# Patient Record
Sex: Female | Born: 1995 | Race: White | Hispanic: No | Marital: Single | Smoking: Never smoker
Health system: Southern US, Community
[De-identification: ages and names within clinical notes are randomized; demographics above are authoritative.]

## PROBLEM LIST (undated history)

## (undated) DIAGNOSIS — F32A Depression, unspecified: Secondary | ICD-10-CM

## (undated) DIAGNOSIS — F319 Bipolar disorder, unspecified: Secondary | ICD-10-CM

## (undated) DIAGNOSIS — F329 Major depressive disorder, single episode, unspecified: Secondary | ICD-10-CM

## (undated) HISTORY — PX: WISDOM TOOTH EXTRACTION: SHX21

## (undated) HISTORY — PX: TONSILLECTOMY: SUR1361

---
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
## 2013-01-31 ENCOUNTER — Other Ambulatory Visit: Payer: Self-pay | Admitting: Internal Medicine

## 2013-12-20 ENCOUNTER — Encounter (HOSPITAL_COMMUNITY): Payer: Self-pay | Admitting: Emergency Medicine

## 2013-12-20 ENCOUNTER — Emergency Department (HOSPITAL_COMMUNITY)
Admission: EM | Admit: 2013-12-20 | Discharge: 2013-12-21 | Disposition: A | Discharge status: Home / Self Care | Payer: BC Managed Care – PPO | Attending: Emergency Medicine | Admitting: Emergency Medicine

## 2013-12-20 DIAGNOSIS — J45901 Unspecified asthma with (acute) exacerbation: Secondary | ICD-10-CM | POA: Insufficient documentation

## 2013-12-20 DIAGNOSIS — R0602 Shortness of breath: Secondary | ICD-10-CM

## 2013-12-20 DIAGNOSIS — F3289 Other specified depressive episodes: Secondary | ICD-10-CM | POA: Insufficient documentation

## 2013-12-20 DIAGNOSIS — F329 Major depressive disorder, single episode, unspecified: Secondary | ICD-10-CM | POA: Insufficient documentation

## 2013-12-20 DIAGNOSIS — R404 Transient alteration of awareness: Secondary | ICD-10-CM | POA: Insufficient documentation

## 2013-12-20 DIAGNOSIS — F319 Bipolar disorder, unspecified: Secondary | ICD-10-CM | POA: Insufficient documentation

## 2013-12-20 DIAGNOSIS — J029 Acute pharyngitis, unspecified: Secondary | ICD-10-CM | POA: Insufficient documentation

## 2013-12-20 HISTORY — DX: Depression, unspecified: F32.A

## 2013-12-20 HISTORY — DX: Bipolar disorder, unspecified: F31.9

## 2013-12-20 HISTORY — DX: Major depressive disorder, single episode, unspecified: F32.9

## 2013-12-20 NOTE — ED Notes (Signed)
Pt ambulated to restroom with no assistance. ?

## 2013-12-20 NOTE — ED Notes (Addendum)
C/o multiple sx over the last week. Lists sob, unable to get a good deep sufficient breath, also difficulty swallowing, "doesn't come naturally, have to tell myself to swallow", also bilateral jaw tightness, changing comfort and ability to sleep or eat. Upon arrival tonight my legs felt numb". Also nasal congestion. (denies: other pains, fever, nvd), here with mother. Has tried dayquil and ibuprofen, and neb tx.  And inhaler PTA.  Describes pain as generalized"body, 10/10", just feel light headed & dizzy like I am going to pass out all the time, worse with inspirations. Alert, NAD, calm, interactive, resps e/u, no dyspnea noted, LS CTA.

## 2013-12-21 ENCOUNTER — Emergency Department (HOSPITAL_COMMUNITY): Payer: BC Managed Care – PPO

## 2013-12-21 LAB — I-STAT CHEM 8, ED
BUN: 12 mg/dL (ref 6–23)
CALCIUM ION: 1.18 mmol/L (ref 1.12–1.23)
CHLORIDE: 105 meq/L (ref 96–112)
Creatinine, Ser: 0.7 mg/dL (ref 0.50–1.10)
Glucose, Bld: 89 mg/dL (ref 70–99)
HEMATOCRIT: 42 % (ref 36.0–46.0)
HEMOGLOBIN: 14.3 g/dL (ref 12.0–15.0)
Potassium: 4 mEq/L (ref 3.7–5.3)
Sodium: 142 mEq/L (ref 137–147)
TCO2: 24 mmol/L (ref 0–100)

## 2013-12-21 MED ORDER — LORAZEPAM 1 MG PO TABS: 0.5000 mg | ORAL_TABLET | Freq: Three times a day (TID) | ORAL | Status: AC | PRN

## 2013-12-21 NOTE — ED Provider Notes (Signed)
CSN: [id number]     Arrival date & time 12/20/13  2126 History   First MD Initiated Contact with Patient 12/21/13 0005     Chief Complaint  Patient presents with  . Shortness of Breath  . Altered Mental Status  . Sore Throat  . Anxiety     (Consider location/radiation/quality/duration/timing/severity/associated sxs/prior Treatment) HPI Comments: 18 year old female, history of asthma as well as bipolar disorder and recently been told that she had panic attacks and anxiety who presents with a complaint of shortness of breath which has been present for over one week. This initially was mild, the episodes last 30 minutes and occur several times a day. They are spontaneous in onset, they do not improve with albuterol treatments either by metered-dose inhaler or nebulizer, she has not been taking her anti-nausea medication which she has been prescribed, - hydroxyzine. She denies coughing fevers chills nausea vomiting swelling back pain chest pain abdominal pain dysuria or diarrhea. She states that her menstrual cycles have been coming longer and have her but has never been diagnosed with anemia. She does have periods where she wakes up in the nighttime with shortness of breath which again is short-lived and resolve spontaneously. During these episodes she feels like she is "knot in my body" feeling like she is lightheaded or disoriented. Despite the feeling she is able to function in her roll at work while this happens, she works as a Production assistant, radio at [employer].  Patient is a 18 y.o. female presenting with shortness of breath, altered mental status, pharyngitis, and anxiety. The history is provided by the patient and a parent.  Shortness of Breath Altered Mental Status Sore Throat Associated symptoms include shortness of breath.  Anxiety Associated symptoms include shortness of breath.    Past Medical History  Diagnosis Date  . Asthma   . Bipolar disorder   . Depression    Past Surgical History   Procedure Laterality Date  . Tonsillectomy    . Wisdom tooth extraction     No family history on file. History  Substance Use Topics  . Smoking status: Never Smoker   . Smokeless tobacco: Not on file  . Alcohol Use: Yes     Comment: Occassional drinking approx x2 per month   OB History   Grav Para Term Preterm Abortions TAB SAB Ect Mult Living                 Review of Systems  Respiratory: Positive for shortness of breath.   All other systems reviewed and are negative.     Allergies  Lorabid  Home Medications   Prior to Admission medications   Medication Sig Start Date End Date Taking? Authorizing Provider  albuterol (PROVENTIL HFA;VENTOLIN HFA) 108 (90 BASE) MCG/ACT inhaler Inhale 2 puffs into the lungs every 6 (six) hours as needed. For shortness of breath. 10/21/11  Yes Tonye Pearson, MD  ibuprofen (ADVIL,MOTRIN) 200 MG tablet Take 200 mg by mouth every 6 (six) hours as needed for mild pain.   Yes Historical Provider, MD  LORazepam (ATIVAN) 1 MG tablet Take 0.5 tablets (0.5 mg total) by mouth 3 (three) times daily as needed for anxiety. 12/21/13   Vida Roller, MD   BP 106/61  Pulse 69  Temp(Src) 98.3 F (36.8 C) (Oral)  Resp 14  Ht 5\' 5"  (1.651 m)  Wt 127 lb (57.607 kg)  BMI 21.13 kg/m2  SpO2 99%  LMP 12/11/2013 Physical Exam  Nursing note and vitals reviewed. Constitutional:  She appears well-developed and well-nourished. No distress.  HENT:  Head: Normocephalic and atraumatic.  Mouth/Throat: Oropharynx is clear and moist. No oropharyngeal exudate.  Eyes: Conjunctivae and EOM are normal. Pupils are equal, round, and reactive to light. Right eye exhibits no discharge. Left eye exhibits no discharge. No scleral icterus.  Neck: Normal range of motion. Neck supple. No JVD present. No thyromegaly present.  Cardiovascular: Normal rate, regular rhythm, normal heart sounds and intact distal pulses.  Exam reveals no gallop and no friction rub.   No murmur  heard. Pulmonary/Chest: Effort normal and breath sounds normal. No respiratory distress. She has no wheezes. She has no rales.  Abdominal: Soft. Bowel sounds are normal. She exhibits no distension and no mass. There is no tenderness.  Musculoskeletal: Normal range of motion. She exhibits no edema and no tenderness.  Lymphadenopathy:    She has no cervical adenopathy.  Neurological: She is alert. Coordination normal.  Skin: Skin is warm and dry. No rash noted. No erythema.  Psychiatric: She has a normal mood and affect. Her behavior is normal.    ED Course  Procedures (including critical care time) Labs Review Labs Reviewed  I-STAT CHEM 8, ED    Imaging Review Dg Chest 2 View  12/21/2013   CLINICAL DATA:  Status post fall, pain  EXAM: CHEST  2 VIEW  COMPARISON:  None.  FINDINGS: The heart size and mediastinal contours are within normal limits. Both lungs are clear. The visualized skeletal structures are unremarkable.  IMPRESSION: No active cardiopulmonary disease.   Electronically Signed   By: Elige KoHetal  Patel   On: 12/21/2013 01:10      MDM   Final diagnoses:  Shortness of breath    The patient does not appear anxious or panic at this time, she does not have any symptoms at this time, her vital signs are normal, no arrhythmia, pulse of 65, strong pulses, no edema, normal lung sounds. Chest x-ray, i-STAT to evaluate for electrolytes and anemia, otherwise well-appearing and likely need psychiatric followup for better control of anxiety.  Labs normal - pt and family informed - xray normal - ativan encouraged with psych f/u - pt in agreement  Vida RollerBrian D Darik Massing, MD 12/21/13 [phone]

## 2013-12-21 NOTE — Discharge Instructions (Signed)
Your tests are normal - follow up with [facility], instead of hydroxyzine, try ativan.

## 2013-12-21 NOTE — ED Notes (Signed)
Pt returned from xray

## 2013-12-21 NOTE — ED Notes (Signed)
Discharge instructions reviewed with pt. Pt verbalized understanding.   

## 2013-12-21 NOTE — ED Notes (Signed)
Pt reports she has been unable to catch her breath for about a week. Pt reports she has asthma but this does not feel like when she has an attack. Pt reports her throat feels tight, and has difficulty swallowing. Pt also reports a general feeling of not feeling well. Pt states she has been disoriented, and out of it. Pt states she is having general discomfort in her chest. Pt reports she has been having panic attacks more frequently since she has been feeling like she can't catch her breath. Pt takes hydroxyzine for attacks.

## 2015-03-29 IMAGING — CR DG CHEST 2V
2 series · 2 of 2 positions shown · non-contrast
Comparison: None.

CLINICAL DATA: Status post fall, pain

EXAM:
CHEST  2 VIEW

[w chest pa]
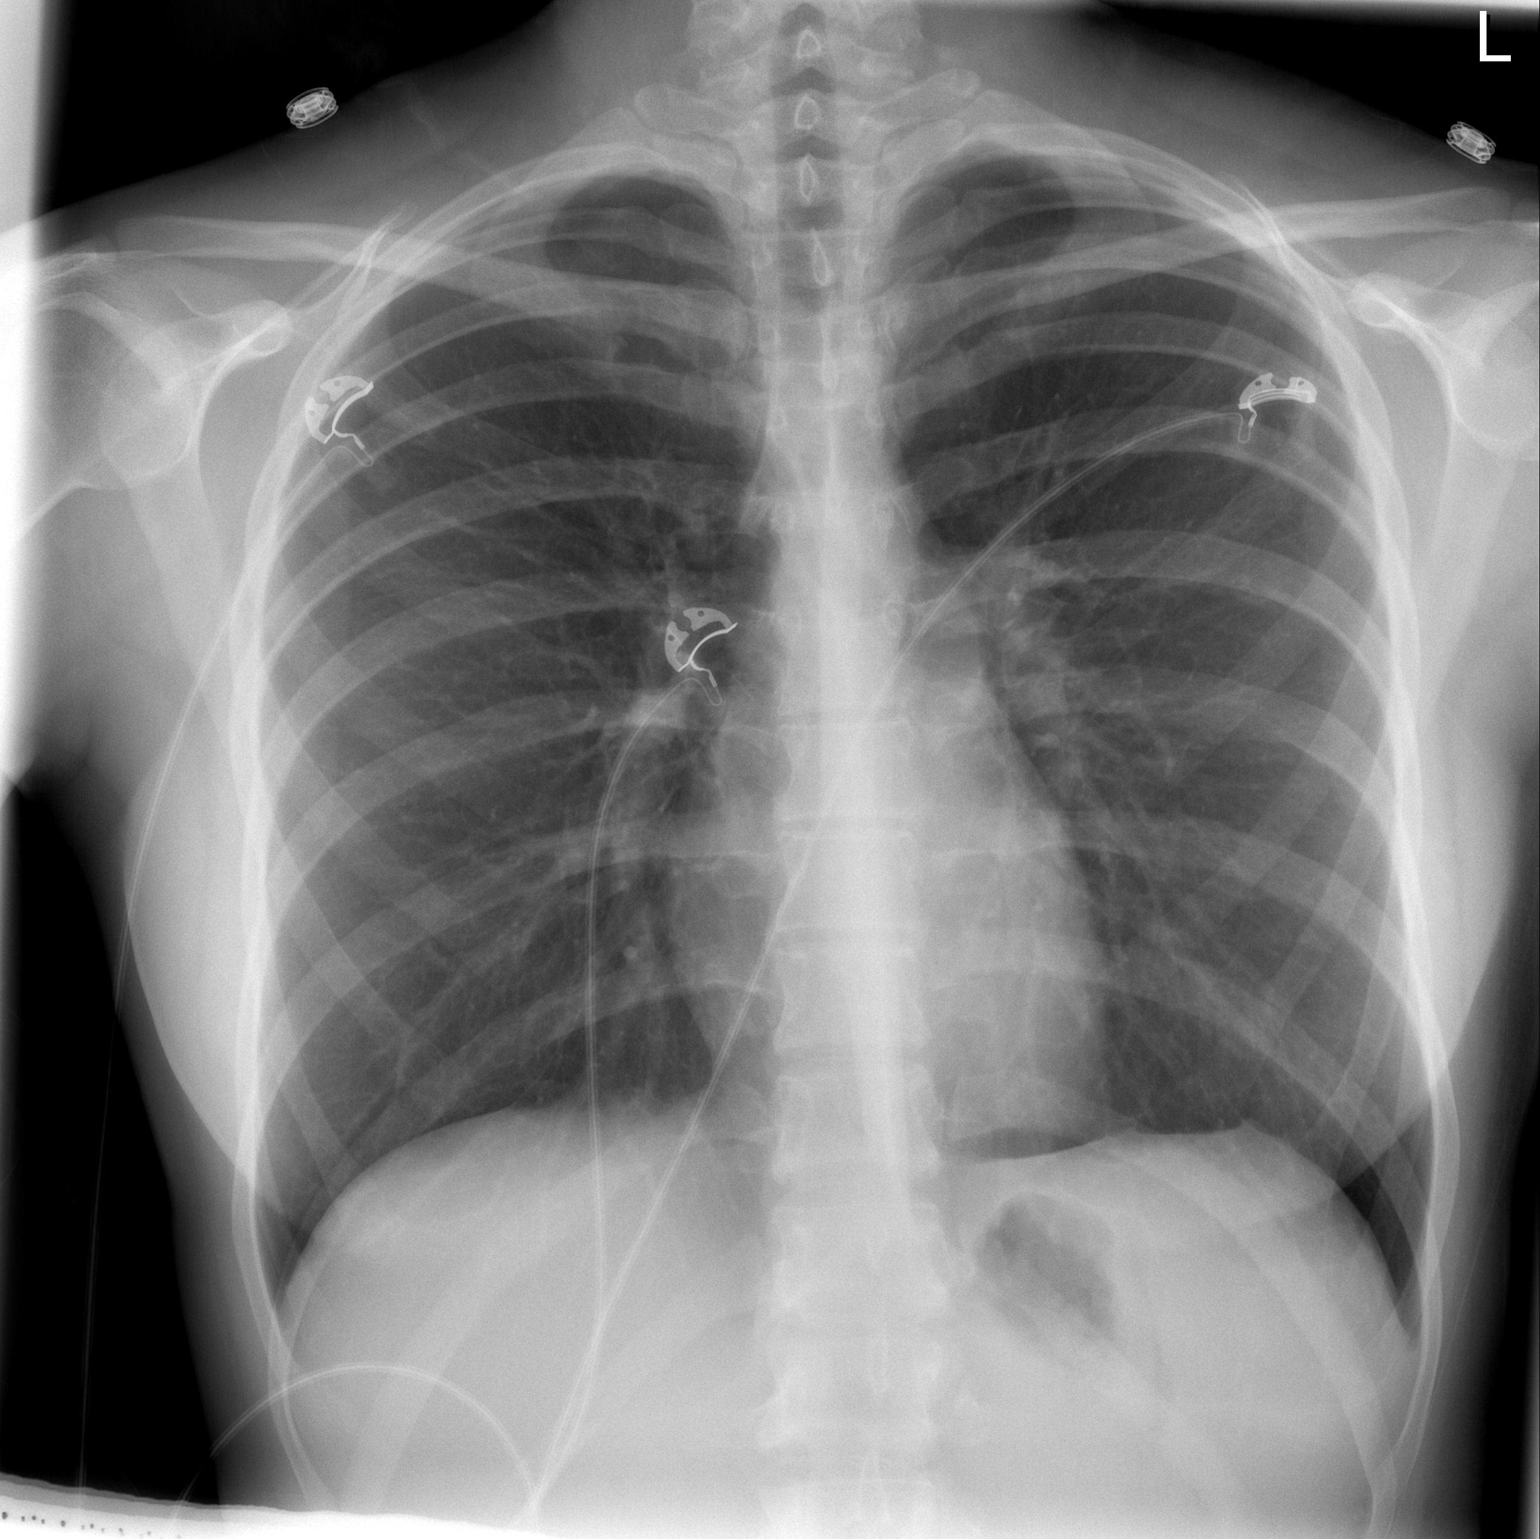

[w chest lat]
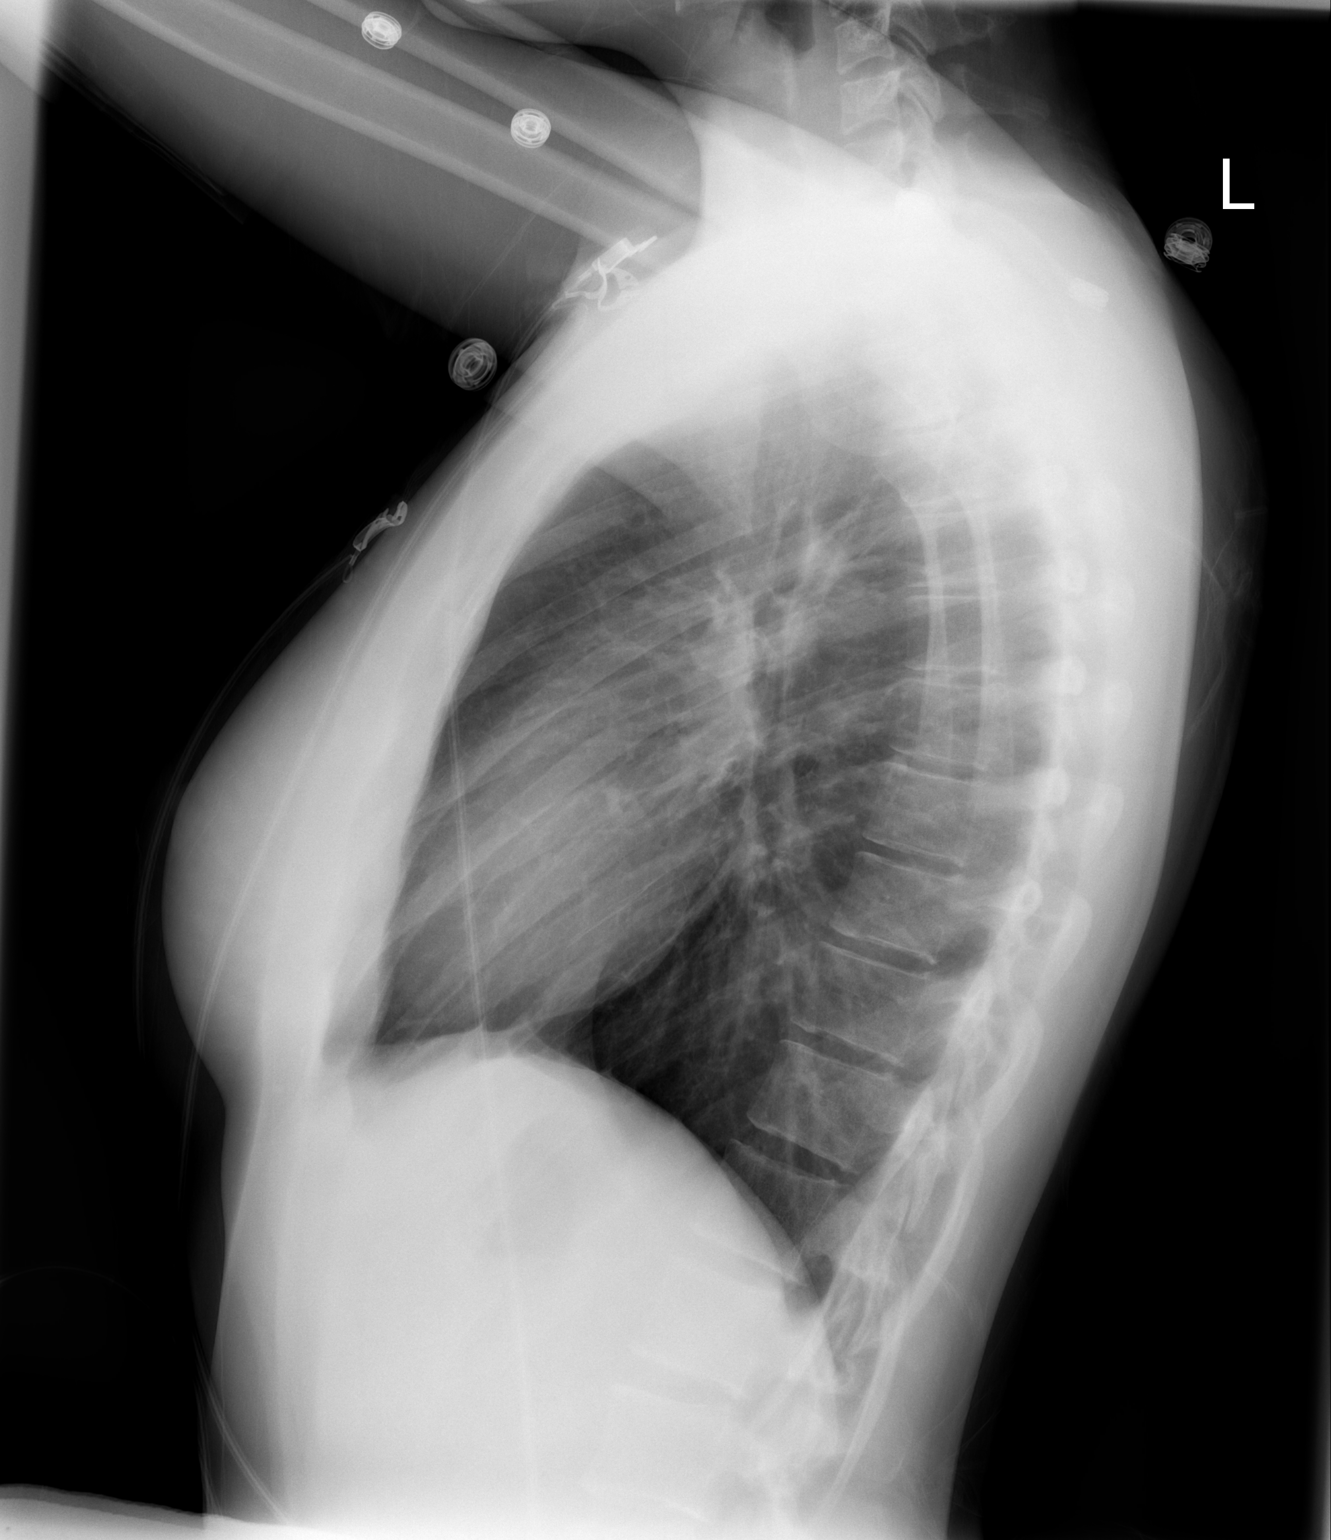

[2 of 2 positions shown; findings below may reference images not displayed]

FINDINGS: The heart size and mediastinal contours are within normal limits.
Both lungs are clear. The visualized skeletal structures are
unremarkable.
IMPRESSION: No active cardiopulmonary disease.

## 2024-05-03 DIAGNOSIS — J45901 Unspecified asthma with (acute) exacerbation: Secondary | ICD-10-CM | POA: Diagnosis not present

## 2024-05-03 DIAGNOSIS — R0600 Dyspnea, unspecified: Secondary | ICD-10-CM | POA: Diagnosis not present

## 2024-05-03 DIAGNOSIS — R002 Palpitations: Secondary | ICD-10-CM | POA: Diagnosis not present
